# Patient Record
Sex: Male | Born: 1995 | Marital: Single | Smoking: Current every day smoker
Health system: Southern US, Community
[De-identification: ages and names within clinical notes are randomized; demographics above are authoritative.]

## PROBLEM LIST (undated history)

## (undated) DIAGNOSIS — F419 Anxiety disorder, unspecified: Secondary | ICD-10-CM

## (undated) DIAGNOSIS — F329 Major depressive disorder, single episode, unspecified: Secondary | ICD-10-CM

## (undated) DIAGNOSIS — F32A Depression, unspecified: Secondary | ICD-10-CM

## (undated) HISTORY — PX: NO PAST SURGERIES: SHX2092

---
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
## 2020-06-24 ENCOUNTER — Encounter: Payer: Self-pay | Admitting: Emergency Medicine

## 2020-06-24 ENCOUNTER — Other Ambulatory Visit: Payer: Self-pay

## 2020-06-24 ENCOUNTER — Emergency Department: Payer: BC Managed Care – PPO

## 2020-06-24 DIAGNOSIS — R002 Palpitations: Secondary | ICD-10-CM | POA: Insufficient documentation

## 2020-06-24 DIAGNOSIS — R000 Tachycardia, unspecified: Secondary | ICD-10-CM | POA: Diagnosis present

## 2020-06-24 DIAGNOSIS — F17200 Nicotine dependence, unspecified, uncomplicated: Secondary | ICD-10-CM | POA: Insufficient documentation

## 2020-06-24 LAB — CBC WITH DIFFERENTIAL/PLATELET
Abs Immature Granulocytes: 0.04 10*3/uL (ref 0.00–0.07)
Basophils Absolute: 0.1 10*3/uL (ref 0.0–0.1)
Basophils Relative: 0 %
Eosinophils Absolute: 0 10*3/uL (ref 0.0–0.5)
Eosinophils Relative: 0 %
HCT: 40.7 % (ref 39.0–52.0)
Hemoglobin: 13.7 g/dL (ref 13.0–17.0)
Immature Granulocytes: 0 %
Lymphocytes Relative: 23 %
Lymphs Abs: 2.7 10*3/uL (ref 0.7–4.0)
MCH: 31 pg (ref 26.0–34.0)
MCHC: 33.7 g/dL (ref 30.0–36.0)
MCV: 92.1 fL (ref 80.0–100.0)
Monocytes Absolute: 0.7 10*3/uL (ref 0.1–1.0)
Monocytes Relative: 6 %
Neutro Abs: 8.2 10*3/uL — ABNORMAL HIGH (ref 1.7–7.7)
Neutrophils Relative %: 71 %
Platelets: 255 10*3/uL (ref 150–400)
RBC: 4.42 MIL/uL (ref 4.22–5.81)
RDW: 12.6 % (ref 11.5–15.5)
WBC: 11.7 10*3/uL — ABNORMAL HIGH (ref 4.0–10.5)
nRBC: 0 % (ref 0.0–0.2)

## 2020-06-24 LAB — COMPREHENSIVE METABOLIC PANEL
ALT: 17 U/L (ref 0–44)
AST: 30 U/L (ref 15–41)
Albumin: 4.9 g/dL (ref 3.5–5.0)
Alkaline Phosphatase: 49 U/L (ref 38–126)
Anion gap: 14 (ref 5–15)
BUN: 14 mg/dL (ref 6–20)
CO2: 21 mmol/L — ABNORMAL LOW (ref 22–32)
Calcium: 9.5 mg/dL (ref 8.9–10.3)
Chloride: 104 mmol/L (ref 98–111)
Creatinine, Ser: 0.99 mg/dL (ref 0.61–1.24)
GFR, Estimated: >60 mL/min (ref >60)
Glucose, Bld: 139 mg/dL — ABNORMAL HIGH (ref 70–99)
Potassium: 3.5 mmol/L (ref 3.5–5.1)
Sodium: 139 mmol/L (ref 135–145)
Total Bilirubin: 0.5 mg/dL (ref 0.3–1.2)
Total Protein: 7.6 g/dL (ref 6.5–8.1)

## 2020-06-24 LAB — TROPONIN I (HIGH SENSITIVITY): Troponin I (High Sensitivity): <2 ng/L (ref <18)

## 2020-06-24 NOTE — ED Triage Notes (Addendum)
Pt to triage via w/c, appears anxious, hyperventilating; reports sensation of heart racing after smoking "delta A" earlier today; reports some pain to left axillae upon inspiration; st hx anxiety

## 2020-06-25 ENCOUNTER — Emergency Department
Admission: EM | Admit: 2020-06-25 | Discharge: 2020-06-25 | Disposition: A | Discharge status: Home / Self Care | Payer: BC Managed Care – PPO | Attending: Emergency Medicine | Admitting: Emergency Medicine

## 2020-06-25 DIAGNOSIS — R000 Tachycardia, unspecified: Secondary | ICD-10-CM

## 2020-06-25 LAB — URINE DRUG SCREEN, QUALITATIVE (ARMC ONLY)
Amphetamines, Ur Screen: NOT DETECTED
Barbiturates, Ur Screen: NOT DETECTED
Benzodiazepine, Ur Scrn: NOT DETECTED
Cannabinoid 50 Ng, Ur ~~LOC~~: POSITIVE — AB
Cocaine Metabolite,Ur ~~LOC~~: NOT DETECTED
MDMA (Ecstasy)Ur Screen: NOT DETECTED
Methadone Scn, Ur: NOT DETECTED
Opiate, Ur Screen: NOT DETECTED
Phencyclidine (PCP) Ur S: NOT DETECTED

## 2020-06-25 LAB — TROPONIN I (HIGH SENSITIVITY): Troponin I (High Sensitivity): 3 ng/L (ref <18)

## 2020-06-25 NOTE — ED Notes (Signed)
Pt visualized ambulatory to the lobby at this time with NAD noted at this time after speaking with EDP. Pt given D/C papers prior to EDP arrival to bedside.

## 2020-06-25 NOTE — ED Notes (Signed)
EDP to bedside to speak with patient prior to discharge.

## 2020-06-25 NOTE — ED Notes (Signed)
Pt's parents contacted regarding patient's discharge with patient's permission. Pt's father states is on the way to pick patient up. Pt requesting to speak with EDP again. EDP made aware and states will go to bedside and speak with patient prior to discharge. Pt denies comments/concerns regarding D/C instructions at this time, pt removed from monitors by this RN.

## 2020-06-25 NOTE — ED Provider Notes (Signed)
[facility] Emergency Department Provider Note  ____________________________________________  Time seen: Approximately 7:06 AM  I have reviewed the triage vital signs and the nursing notes.   HISTORY  Chief Complaint Tachycardia    HPI Tyler Mooney is a 24 y.o. male with a history of anxiety depression who comes ED complaining of palpitations and fast heart rate.  This started 15 minutes after smoking marijuana and another substance called delta 8.  Denies any known stimulant abuse such as cocaine or amphetamine.  No significant chest pain, no shortness of breath.  No syncope.  Symptoms are constant, but dramatically improved since arrival in the treatment room.  No radiating pain, no shortness of breath.      Past Medical History:  Diagnosis Date  . Anxiety   . Depression      Patient Active Problem List   Diagnosis Date Noted  . Depression, major, single episode, severe (HCC) 03/22/2012  . Social anxiety disorder 03/22/2012  . Oppositional defiant disorder 03/22/2012     Past Surgical History:  Procedure Laterality Date  . NO PAST SURGERIES       Prior to Admission medications   Medication Sig Start Date End Date Taking? Authorizing Provider  buPROPion (WELLBUTRIN XL) 150 MG 24 hr tablet Take 1 tablet (150 mg total) by mouth daily. 03/28/12 03/28/13  Winson, Louie Bun, NP  citalopram (CELEXA) 40 MG tablet Take 1 tablet (40 mg total) by mouth daily. 03/28/12   Winson, Louie Bun, NP  hydrOXYzine (ATARAX/VISTARIL) 25 MG tablet Take 1 tablet (25mg ) by mouth each morning and 2 tablets (50mg ) by mouth at bedtime. 03/28/12   Winson, , NP     Allergies Patient has no known allergies.   No family history on file.  Social History Social History   Tobacco Use  . Smoking status: Current Every Day Smoker  Vaping Use  . Vaping Use: Every day  Substance Use Topics  . Alcohol use: Yes    Alcohol/week: 0.0 standard drinks    Comment: 2-3 drinks q  month  . Drug use: Yes    Types: Marijuana    Comment: q 2 weeks    Review of Systems  Constitutional:   No fever or chills.  ENT:   No sore throat. No rhinorrhea. Cardiovascular:   No chest pain or syncope.  Positive palpitations Respiratory:   No dyspnea or cough. Gastrointestinal:   Negative for abdominal pain, vomiting and diarrhea.  Musculoskeletal:   Negative for focal pain or swelling All other systems reviewed and are negative except as documented above in ROS and HPI.  ____________________________________________   PHYSICAL EXAM:  VITAL SIGNS: ED Triage Vitals  Enc Vitals Group     BP 06/24/20 2142 127/65     Pulse Rate 06/24/20 2142 (!) 122     Resp 06/24/20 2142 18     Temp 06/24/20 2142 97.9 F (36.6 C)     Temp Source 06/24/20 2142 Oral     SpO2 06/24/20 2142 100 %     Weight 06/24/20 2140 125 lb (56.7 kg)     Height 06/24/20 2140 6\' 1"  (1.854 m)     Head Circumference --      Peak Flow --      Pain Score 06/24/20 2140 3     Pain Loc --      Pain Edu? --      Excl. in GC? --     Vital signs reviewed, nursing assessments reviewed.  Constitutional:   Alert and oriented. Non-toxic appearance. Eyes:   Conjunctivae are normal. EOMI. PERRL. ENT      Head:   Normocephalic and atraumatic.      Nose:   Wearing a mask.      Mouth/Throat:   Wearing a mask.      Neck:   No meningismus. Full ROM. Hematological/Lymphatic/Immunilogical:   No cervical lymphadenopathy. Cardiovascular:   RRR. Symmetric bilateral radial and DP pulses.  No murmurs. Cap refill less than 2 seconds. Respiratory:   Normal respiratory effort without tachypnea/retractions. Breath sounds are clear and equal bilaterally. No wheezes/rales/rhonchi. Gastrointestinal:   Soft and nontender. Non distended. There is no CVA tenderness.  No rebound, rigidity, or guarding. Genitourinary:   deferred Musculoskeletal:   Normal range of motion in all extremities. No joint effusions.  No lower extremity  tenderness.  No edema. Neurologic:   Normal speech and language.  Motor grossly intact. No acute focal neurologic deficits are appreciated.  Skin:    Skin is warm, dry and intact. No rash noted.  No petechiae, purpura, or bullae.  ____________________________________________    LABS (pertinent positives/negatives) (all labs ordered are listed, but only abnormal results are displayed) Labs Reviewed  CBC WITH DIFFERENTIAL/PLATELET - Abnormal; Notable for the following components:      Result Value   WBC 11.7 (*)    Neutro Abs 8.2 (*)    All other components within normal limits  COMPREHENSIVE METABOLIC PANEL - Abnormal; Notable for the following components:   CO2 21 (*)    Glucose, Bld 139 (*)    All other components within normal limits  URINE DRUG SCREEN, QUALITATIVE ([facility] ONLY)  TROPONIN I (HIGH SENSITIVITY)  TROPONIN I (HIGH SENSITIVITY)   ____________________________________________   EKG  Interpreted by me Sinus tachycardia rate 126.  Normal axis and intervals.  Normal QRS ST segments and T waves.  ____________________________________________    RADIOLOGY  DG Chest 2 View  Result Date: 06/24/2020 CLINICAL DATA:  Chest pain EXAM: CHEST - 2 VIEW COMPARISON:  None. FINDINGS: The heart size and mediastinal contours are within normal limits. Both lungs are clear. The visualized skeletal structures are unremarkable. IMPRESSION: No active cardiopulmonary disease. Electronically Signed   By: Deatra Robinson M.D.   On: 06/24/2020 22:38    ____________________________________________   PROCEDURES Procedures  ____________________________________________    CLINICAL IMPRESSION / ASSESSMENT AND PLAN / ED COURSE  Medications ordered in the ED: Medications - No data to display  Pertinent labs & imaging results that were available during my care of the patient were reviewed by me and considered in my medical decision making (see chart for details).  Tyler Mooney  was evaluated in Emergency Department on 06/25/2020 for the symptoms described in the history of present illness. He was evaluated in the context of the global COVID-19 pandemic, which necessitated consideration that the patient might be at risk for infection with the SARS-CoV-2 virus that causes COVID-19. Institutional protocols and algorithms that pertain to the evaluation of patients at risk for COVID-19 are in a state of rapid change based on information released by regulatory bodies including the CDC and federal and state organizations. These policies and algorithms were followed during the patient's care in the ED.   Patient presents with tachycardia and agitation after smoking marijuana and another recreational substance.  Consistent with typical side effects.  On arrival to treatment room, heart rate normalized.  Other vital signs are normal.  Symptoms are resolved and exam is  very reassuring.  Doubt underlying cardiac disease or dysrhythmia.  He stable for discharge home, agrees to abstain from drugs.      ____________________________________________   FINAL CLINICAL IMPRESSION(S) / ED DIAGNOSES    Final diagnoses:  Tachycardia     ED Discharge Orders    None      Portions of this note were generated with dragon dictation software. Dictation errors may occur despite best attempts at proofreading.   Sharman Cheek, MD 06/25/20 [phone]

## 2020-06-25 NOTE — ED Notes (Signed)
Pt states he smoked marijuana around 1900 and about 15 min after he started having palpitations, dizziness, and face numbness. Pt states he smoke marijuana several times a week and has never had this reaction. Pt denies any other drug use, states still having left sided chest pain at this time.

## 2020-06-27 ENCOUNTER — Ambulatory Visit (INDEPENDENT_AMBULATORY_CARE_PROVIDER_SITE_OTHER): Payer: BC Managed Care – PPO | Admitting: Cardiovascular Disease

## 2020-06-27 ENCOUNTER — Other Ambulatory Visit: Payer: Self-pay

## 2020-06-27 ENCOUNTER — Encounter: Payer: Self-pay | Admitting: Cardiovascular Disease

## 2020-06-27 VITALS — BP 100/80 | HR 88 | Ht 73.5 in | Wt 140.0 lb

## 2020-06-27 DIAGNOSIS — I479 Paroxysmal tachycardia, unspecified: Secondary | ICD-10-CM | POA: Diagnosis not present

## 2020-06-27 DIAGNOSIS — R079 Chest pain, unspecified: Secondary | ICD-10-CM | POA: Diagnosis not present

## 2020-06-27 NOTE — Patient Instructions (Signed)
Medication Instructions:  No new medications  *If you need a refill on your cardiac medications before your next appointment, please call your pharmacy*   Lab Work: none If you have labs (blood work) drawn today and your tests are completely normal, you will receive your results only by: Marland Kitchen MyChart Message (if you have MyChart) OR . A paper copy in the mail If you have any lab test that is abnormal or we need to change your treatment, we will call you to review the results.   Testing/Procedures: Your physician has requested that you have an echocardiogram. Echocardiography is a painless test that uses sound waves to create images of your heart. It provides your doctor with information about the size and shape of your heart and how well your heart's chambers and valves are working. This procedure takes approximately one hour. There are no restrictions for this procedure.  There is a possibility that an IV may need to be started during your test to inject an image enhancing agent. This is done to obtain more optimal pictures of your heart. Therefore we ask that you do at least drink some water prior to coming in to hydrate your veins.     Follow-Up: At [facility], you and your health needs are our priority.  As part of our continuing mission to provide you with exceptional heart care, we have created designated Provider Care Teams.  These Care Teams include your primary Cardiologist (physician) and Advanced Practice Providers (APPs -  Physician Assistants and Nurse Practitioners) who all work together to provide you with the care you need, when you need it.  We recommend signing up for the patient portal called "MyChart".  Sign up information is provided on this After Visit Summary.  MyChart is used to connect with patients for Virtual Visits (Telemedicine).  Patients are able to view lab/test results, encounter notes, upcoming appointments, etc.  Non-urgent messages can be sent to your  provider as well.   To learn more about what you can do with MyChart, go to [email or web address].    Your next appointment:   As needed      Other Instructions N/A   Echocardiogram An echocardiogram is a procedure that uses painless sound waves (ultrasound) to produce an image of the heart. Images from an echocardiogram can provide important information about:  Signs of coronary artery disease (CAD).  Aneurysm detection. An aneurysm is a weak or damaged part of an artery wall that bulges out from the normal force of blood pumping through the body.  Heart size and shape. Changes in the size or shape of the heart can be associated with certain conditions, including heart failure, aneurysm, and CAD.  Heart muscle function.  Heart valve function.  Signs of a past heart attack.  Fluid buildup around the heart.  Thickening of the heart muscle.  A tumor or infectious growth around the heart valves. Tell a health care provider about:  Any allergies you have.  All medicines you are taking, including vitamins, herbs, eye drops, creams, and over-the-counter medicines.  Any blood disorders you have.  Any surgeries you have had.  Any medical conditions you have.  Whether you are pregnant or may be pregnant. What are the risks? Generally, this is a safe procedure. However, problems may occur, including:  Allergic reaction to dye (contrast) that may be used during the procedure. What happens before the procedure? No specific preparation is needed. You may eat and drink normally. What happens during the  procedure?   An IV tube may be inserted into one of your veins.  You may receive contrast through this tube. A contrast is an injection that improves the quality of the pictures from your heart.  A gel will be applied to your chest.  A wand-like tool (transducer) will be moved over your chest. The gel will help to transmit the sound waves from the transducer.  The  sound waves will harmlessly bounce off of your heart to allow the heart images to be captured in real-time motion. The images will be recorded on a computer. The procedure may vary among health care providers and hospitals. What happens after the procedure?  You may return to your normal, everyday life, including diet, activities, and medicines, unless your health care provider tells you not to do that. Summary  An echocardiogram is a procedure that uses painless sound waves (ultrasound) to produce an image of the heart.  Images from an echocardiogram can provide important information about the size and shape of your heart, heart muscle function, heart valve function, and fluid buildup around your heart.  You do not need to do anything to prepare before this procedure. You may eat and drink normally.  After the echocardiogram is completed, you may return to your normal, everyday life, unless your health care provider tells you not to do that. This information is not intended to replace advice given to you by your health care provider. Make sure you discuss any questions you have with your health care provider. Document Revised: 11/03/2018 Document Reviewed: 08/15/2016 Elsevier Patient Education  [place].

## 2020-06-27 NOTE — Progress Notes (Signed)
Cardiology Office Note   Date:  06/27/2020   ID:  Tyler, Mooney 1996/01/18, MRN [id number]  PCP:  [facility]  Cardiologist:   Lorine Bears, MD   Chief Complaint  Patient presents with  . New Patient (Initial Visit)    ED F/U-palpitations, chest pain, and "chest weakness"; Meds verbally reviewed with patient.      History of Present Illness: Tyler Mooney is a 24 y.o. male who is self-referred for evaluation of palpitation and chest pain.  He has known history of anxiety and depression.  He smokes electronic cigarettes and uses marijuana on a regular basis.  Recently, he used a substance called delta 8 followed by smoking marijuana.  Shortly after that he had severe tachycardia with a heart rate ranging from 160 to 180 bpm.  He had dizziness and presyncope with associated chest pain.  He started hyperventilating and had some numbness in his face.  He went to the emergency room at [facility] where he was found to have sinus tachycardia with heart rate in the 120 range.  Urine drug screen was only positive for cannabinoids.  The rest of his labs were unremarkable including normal troponin. The patient stopped using all substances since then with no recurrent palpitations but he continues to have constant chest discomfort.  It it feels like soreness in the chest.  He is very anxious about this.  He denies excessive alcohol use.  There is no family history of coronary artery disease, arrhythmia or premature death.    Past Medical History:  Diagnosis Date  . Anxiety   . Depression     Past Surgical History:  Procedure Laterality Date  . NO PAST SURGERIES       Current Outpatient Medications  Medication Sig Dispense Refill  . EPINEPHrine 0.3 mg/0.3 mL IJ SOAJ injection Inject 0.3 mg into the muscle as needed.     No current facility-administered medications for this visit.    Allergies:   Patient has no known allergies.    Social  History:  The patient  reports that he has been smoking e-cigarettes. He has quit using smokeless tobacco. He reports previous alcohol use. He reports current drug use. Drug: Marijuana.   Family History:  The patient's family history is not on file.    ROS:  Please see the history of present illness.   Otherwise, review of systems are positive for none.   All other systems are reviewed and negative.    PHYSICAL EXAM: VS:  BP 100/80 (BP Location: Right Arm, Patient Position: Sitting, Cuff Size: Normal)   Pulse 88   Ht 6' 1.5" (1.867 m)   Wt 140 lb (63.5 kg)   SpO2 97%   BMI 18.22 kg/m  , BMI Body mass index is 18.22 kg/m. GEN: Well nourished, well developed, in no acute distress  HEENT: normal  Neck: no JVD, carotid bruits, or masses Cardiac: RRR; no murmurs, rubs, or gallops,no edema  Respiratory:  clear to auscultation bilaterally, normal work of breathing GI: soft, nontender, nondistended, + BS MS: no deformity or atrophy  Skin: warm and dry, no rash Neuro:  Strength and sensation are intact Psych: euthymic mood, full affect   EKG:  EKG is ordered today. The ekg ordered today demonstrates normal sinus rhythm with sinus arrhythmia   Recent Labs: 06/24/2020: ALT 17; BUN 14; Creatinine, Ser 0.99; Hemoglobin 13.7; Platelets 255; Potassium 3.5; Sodium 139    Lipid Panel No results found  for: CHOL, TRIG, HDL, CHOLHDL, VLDL, LDLCALC, LDLDIRECT    Wt Readings from Last 3 Encounters:  06/27/20 140 lb (63.5 kg)  06/24/20 125 lb (56.7 kg)        PAD Screen 06/27/2020  Previous PAD dx? No  Previous surgical procedure? No  Pain with walking? No  Feet/toe relief with dangling? No  Painful, non-healing ulcers? No  Extremities discolored? No      ASSESSMENT AND PLAN:  1.  Palpitations and tachycardia: Suspect sinus tachycardia in the setting of substance intoxication.  He took the alternate substance followed by marijuana.  He thinks some of it might have been laced  with something else.  However, urine drug screen was negative other than cannabinoids.  Symptoms overall improved and he is in sinus rhythm today.  EKG is unremarkable.  If he has recurrent symptoms, outpatient monitor can be considered.  2.  Chest pain: He reports persistent chest pain and soreness since his tachycardia episode.  Symptoms are at rest with no EKG changes and recent normal high-sensitivity troponin even in the setting of tachycardia.  I am going to obtain an echocardiogram to ensure no structural heart abnormalities.  No need for stress testing as the patient has minimal risk factors for coronary artery disease at this point. I do suspect that anxiety is playing a role in his symptoms.    Disposition:   FU with me as needed.    Signed,  Lorine Bears, MD  06/27/2020 2:23 PM    [facility]

## 2020-07-18 ENCOUNTER — Other Ambulatory Visit: Payer: BC Managed Care – PPO

## 2020-12-18 ENCOUNTER — Telehealth: Payer: Self-pay | Admitting: Cardiovascular Disease

## 2020-12-18 NOTE — Telephone Encounter (Signed)
Declined testing.  Removed echo order.

## 2021-10-21 IMAGING — CR DG CHEST 2V
2 series · 2 of 2 positions shown · non-contrast
Comparison: None.

CLINICAL DATA: Chest pain

EXAM:
CHEST - 2 VIEW

[chest pa]
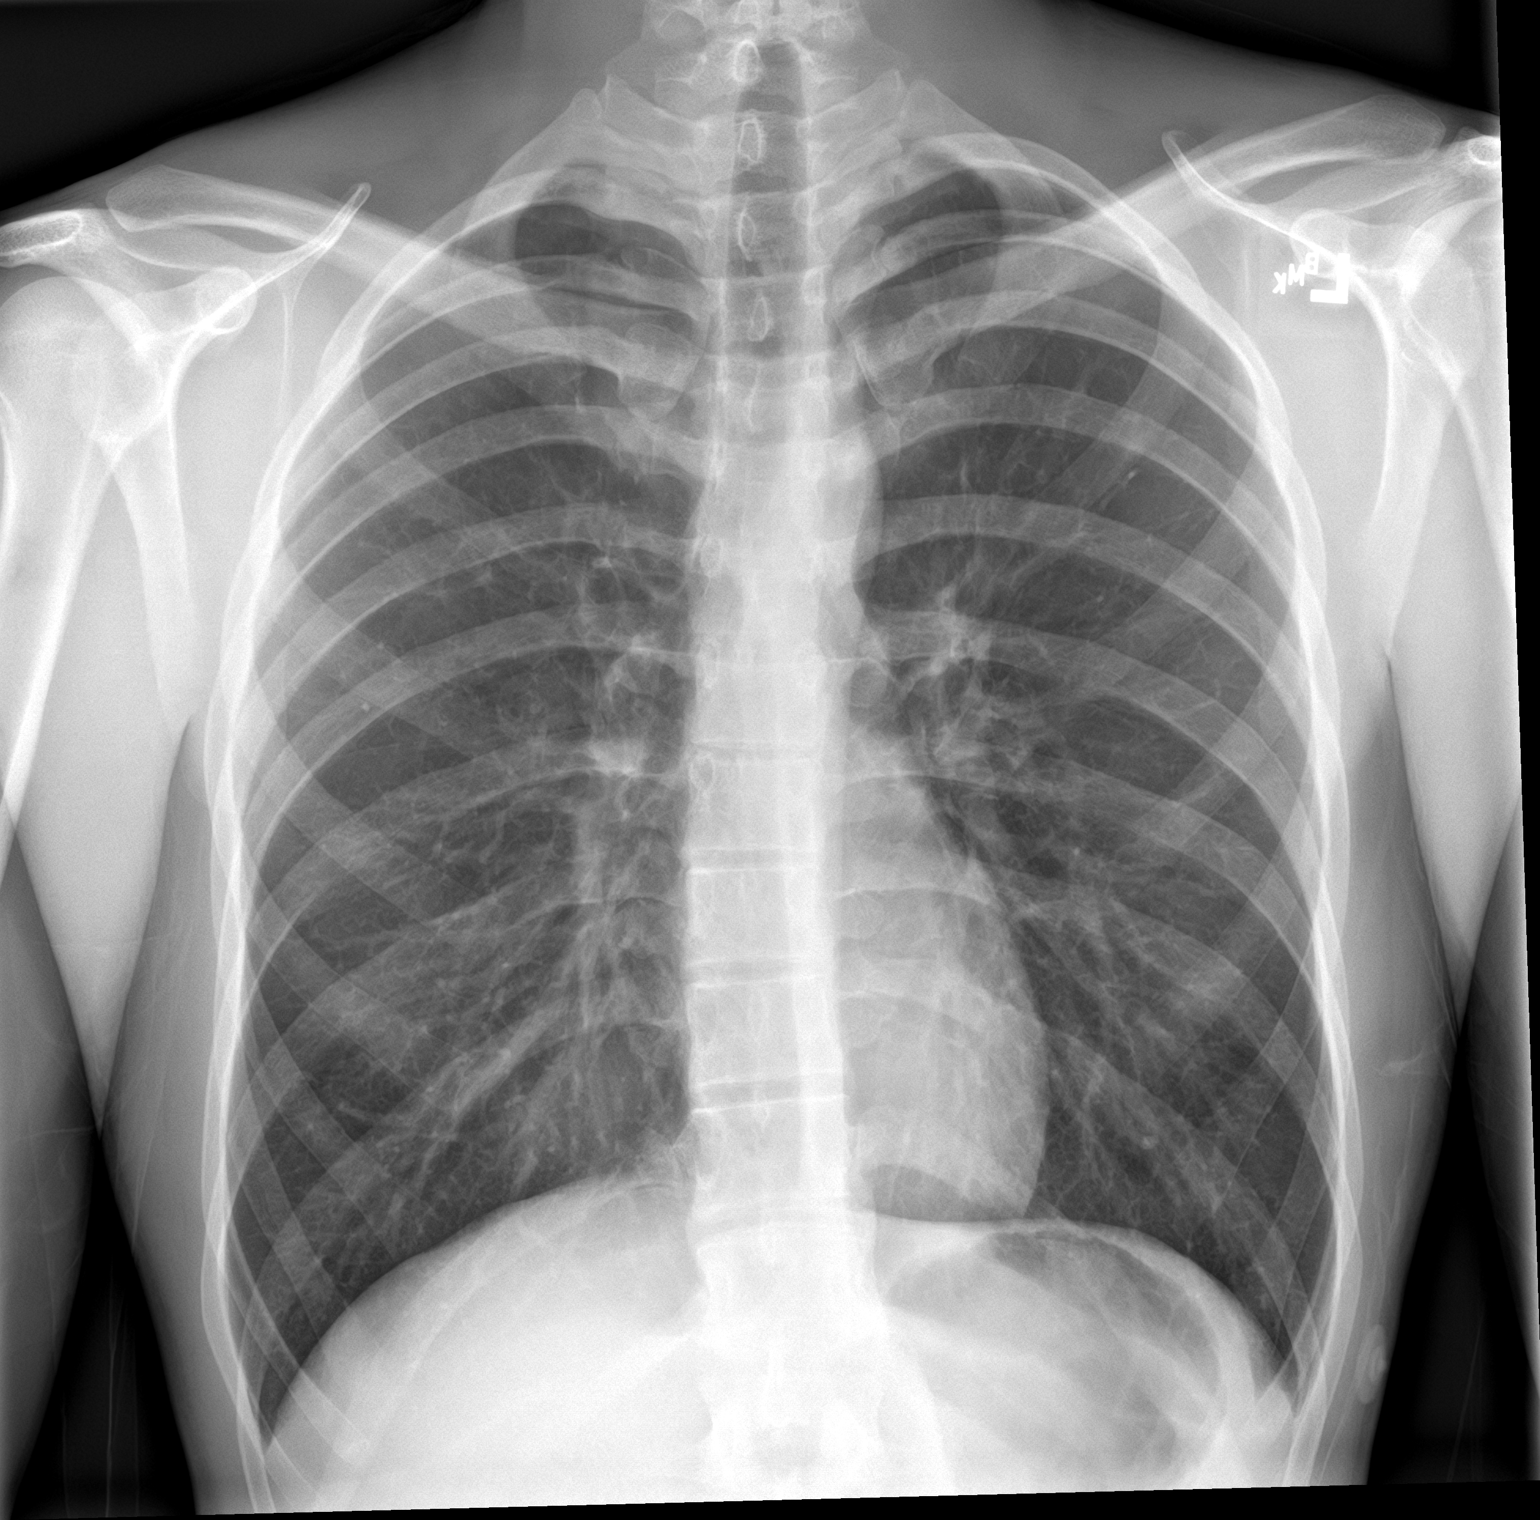

[chest lat]
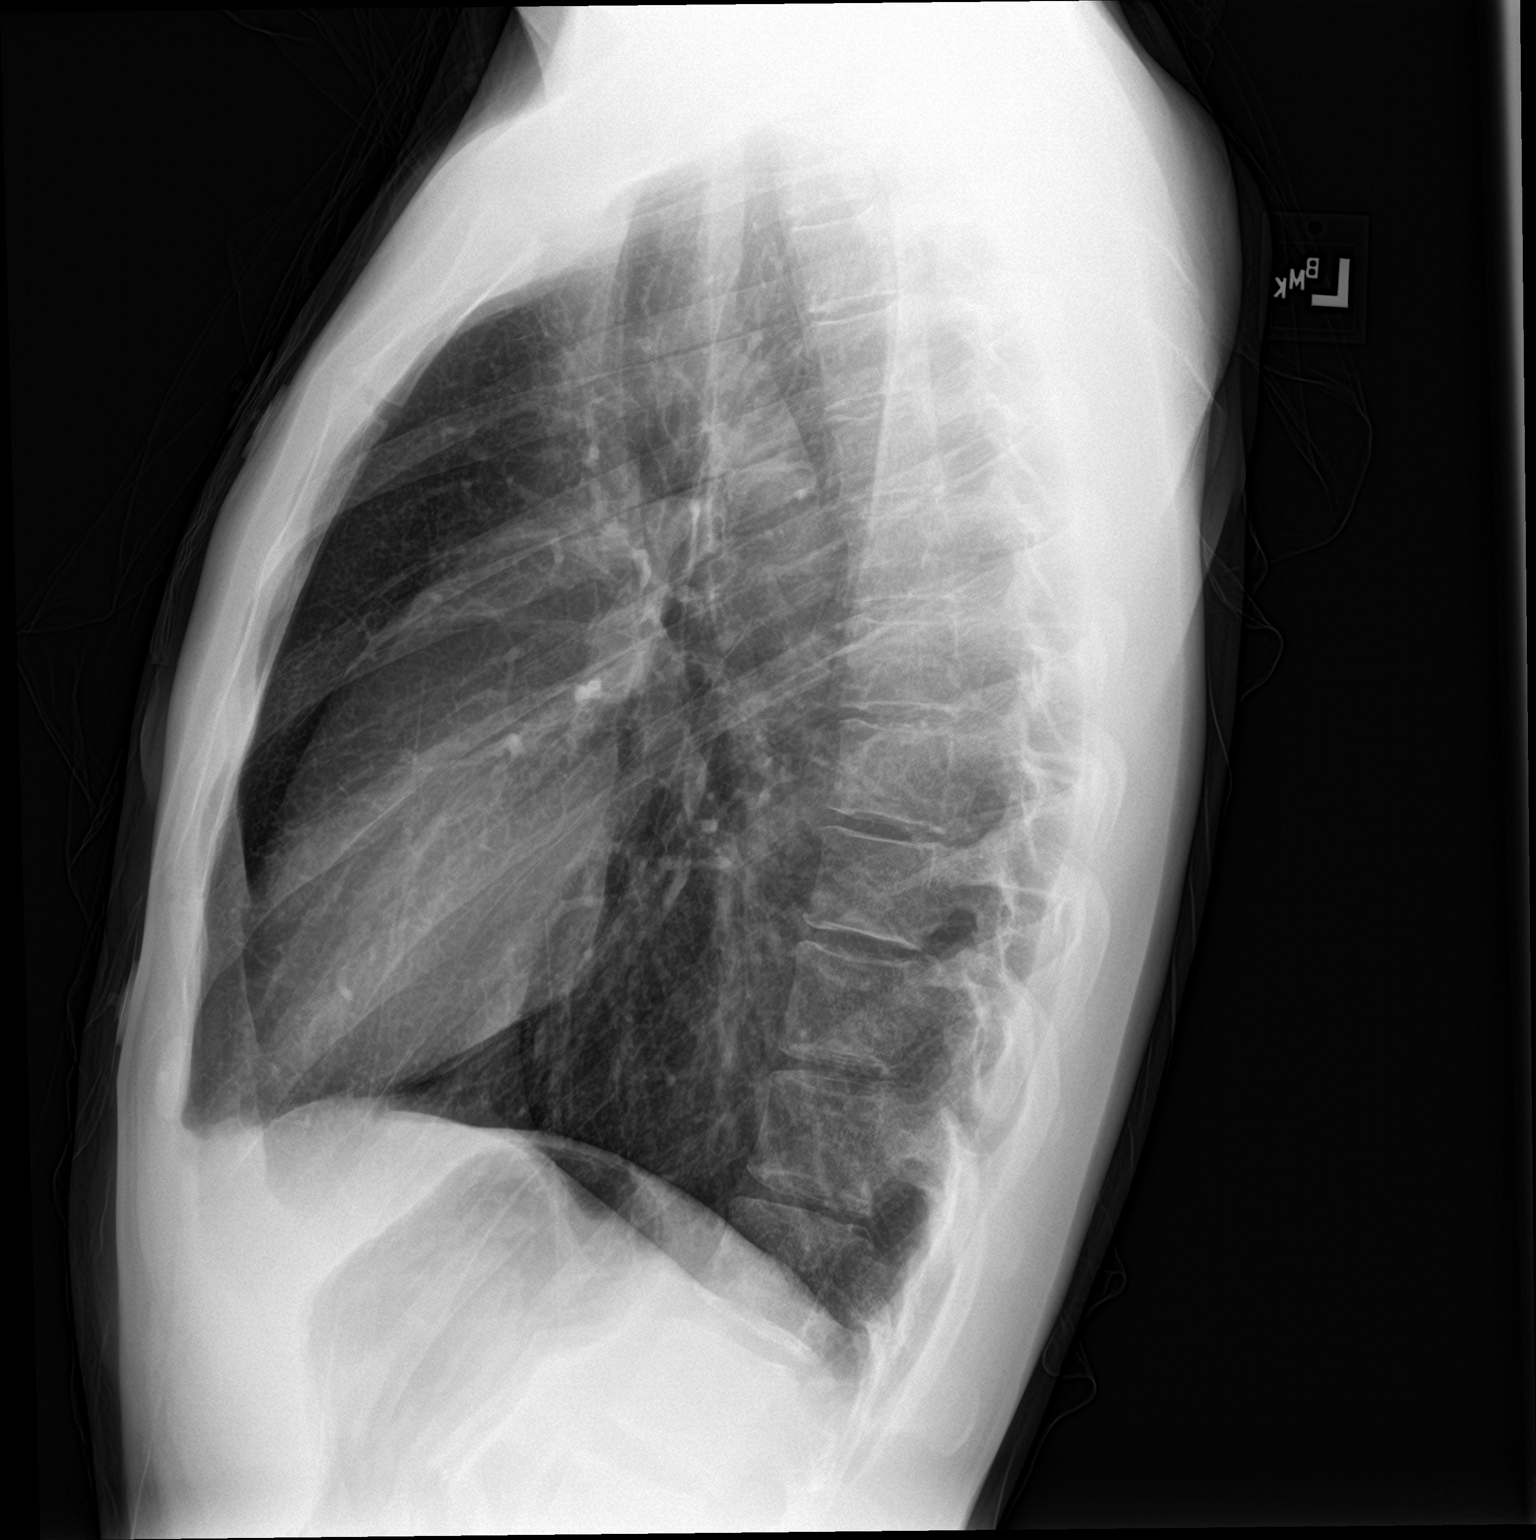

[2 of 2 positions shown; findings below may reference images not displayed]

FINDINGS: The heart size and mediastinal contours are within normal limits.
Both lungs are clear. The visualized skeletal structures are
unremarkable.
IMPRESSION: No active cardiopulmonary disease.
